# Patient Record
Sex: Female | Born: 1965 | Race: White | Hispanic: No | Smoking: Current every day smoker
Health system: Southern US, Community
[De-identification: ages and names within clinical notes are randomized; demographics above are authoritative.]

## PROBLEM LIST (undated history)

## (undated) DIAGNOSIS — F419 Anxiety disorder, unspecified: Secondary | ICD-10-CM

## (undated) DIAGNOSIS — F32A Depression, unspecified: Secondary | ICD-10-CM

## (undated) DIAGNOSIS — F329 Major depressive disorder, single episode, unspecified: Secondary | ICD-10-CM

## (undated) HISTORY — DX: Anxiety disorder, unspecified: F41.9

## (undated) HISTORY — DX: Depression, unspecified: F32.A

## (undated) HISTORY — DX: Major depressive disorder, single episode, unspecified: F32.9

---

## 2015-08-19 ENCOUNTER — Ambulatory Visit (INDEPENDENT_AMBULATORY_CARE_PROVIDER_SITE_OTHER): Payer: BLUE CROSS/BLUE SHIELD | Admitting: Podiatry

## 2015-08-19 ENCOUNTER — Ambulatory Visit: Payer: Self-pay | Admitting: Podiatry

## 2015-08-19 ENCOUNTER — Encounter: Payer: Self-pay | Admitting: Podiatry

## 2015-08-19 VITALS — BP 104/67 | HR 84 | Resp 16

## 2015-08-19 DIAGNOSIS — L600 Ingrowing nail: Secondary | ICD-10-CM

## 2015-08-19 NOTE — Patient Instructions (Signed)

## 2015-08-19 NOTE — Progress Notes (Signed)
   Subjective:    Patient ID: Jo Moore, female    DOB: 1965/06/01, 50 y.o.   MRN: [id number]  HPI: She presents today first toes bilateral tender for many months gets regular pedicures and then has to clean the corners out. She states that the corners are ingrown and it is a genetic predisposition because her father had it. She states that she have the entire toenail taken off and let sit regrow and it does fine for a while and then it'll start to curve and once again. She does not want to have the toenail removed permanently.    Review of Systems  All other systems reviewed and are negative.      Objective:   Physical Exam: Vital signs are stable she is alert and oriented 3 pulses are strongly palpable. Neurologic sensorium is intact. Deep tendon reflexes are intact bilateral and muscle strength was 5 over 5 dorsiflexors plantar flexors and inverters and everters all intrinsic musculature is intact. Orthopedic evaluation demonstrates all joints distal to the ankle for range of motion without crepitation. Cutaneous evaluation demonstrates severe sharp recovery nail margins along the tibial and fibular border of the hallux bilaterally with erythema edema and pain.        Assessment & Plan:  Ingrown nail paronychia hallux bilateral.  Plan: Total nail avulsion temporary in nature after local anesthesia was administered tolerated the procedure well no intraoperative complications. She was provided with bipolar and ongoing instructions for care and soaking of her toes. I will follow-up with her in 1 week.  Arbutus Ped DPM

## 2015-08-28 ENCOUNTER — Ambulatory Visit: Payer: BLUE CROSS/BLUE SHIELD | Admitting: Podiatry

## 2015-09-02 ENCOUNTER — Encounter: Payer: Self-pay | Admitting: Podiatry

## 2015-09-02 ENCOUNTER — Ambulatory Visit (INDEPENDENT_AMBULATORY_CARE_PROVIDER_SITE_OTHER): Payer: BLUE CROSS/BLUE SHIELD | Admitting: Podiatry

## 2015-09-02 DIAGNOSIS — L600 Ingrowing nail: Secondary | ICD-10-CM

## 2015-09-02 NOTE — Progress Notes (Signed)
She presents today for follow-up of known avulsion hallux bilateral. She states it is doing just great discomfort back in the beach while soaking them on the ocean water and playing with them in the sand. She states they feel much better than he did previously states she stop soaking 5 days ago.  Objective: Her signs are stable she is alert and oriented 3. Pulses are palpable no open lesions or wounds. The skin is growing nicely filling in the nail plate and nailbed. See no signs of infection.  Assessment: Well-healing nail avulsion hallux bilateral.  Plan: Discontinue soaking and I will follow-up with her on an as-needed basis. I expect to see her approximately year once those nails regrow.

## 2015-12-26 ENCOUNTER — Other Ambulatory Visit: Payer: Self-pay | Admitting: Obstetrics and Gynecology

## 2015-12-26 DIAGNOSIS — Z1231 Encounter for screening mammogram for malignant neoplasm of breast: Secondary | ICD-10-CM

## 2016-01-31 ENCOUNTER — Encounter (HOSPITAL_COMMUNITY): Payer: Self-pay

## 2016-01-31 ENCOUNTER — Ambulatory Visit
Admission: RE | Admit: 2016-01-31 | Discharge: 2016-01-31 | Disposition: A | Discharge status: Home / Self Care | Payer: BLUE CROSS/BLUE SHIELD | Source: Ambulatory Visit | Attending: Obstetrics and Gynecology | Admitting: Obstetrics and Gynecology

## 2016-01-31 DIAGNOSIS — Z1231 Encounter for screening mammogram for malignant neoplasm of breast: Secondary | ICD-10-CM | POA: Insufficient documentation

## 2016-07-27 ENCOUNTER — Encounter: Payer: Self-pay | Admitting: Podiatry

## 2016-07-27 ENCOUNTER — Ambulatory Visit (INDEPENDENT_AMBULATORY_CARE_PROVIDER_SITE_OTHER): Payer: BLUE CROSS/BLUE SHIELD | Admitting: Podiatry

## 2016-07-27 DIAGNOSIS — L600 Ingrowing nail: Secondary | ICD-10-CM

## 2016-07-27 MED ORDER — NEOMYCIN-POLYMYXIN-HC 1 % OT SOLN: OTIC | 1 refills | Status: DC

## 2016-07-27 NOTE — Patient Instructions (Signed)

## 2016-07-27 NOTE — Progress Notes (Signed)
She presents today to complaint of sharp radial nail margin along the tibia-fibula border of the hallux right.  Objective: Pulses remain palpable she has severe incurvated nail margins with erythema and edema along the tibiofibular border of the hallux nail plate right. Onychocryptosis is present.  Assessment sharp rated ingrown toenails.  Plan: Chemical matrixectomy was performed to the hallux right today. She tolerated procedure well was provided with a prescription for Cortisporin Otic she was also provided with both oral and written home-going instructions for the care and soaking of her toe. Follow up with her in 1-2 weeks.

## 2016-08-17 ENCOUNTER — Ambulatory Visit (INDEPENDENT_AMBULATORY_CARE_PROVIDER_SITE_OTHER): Payer: BLUE CROSS/BLUE SHIELD | Admitting: Podiatry

## 2016-08-17 ENCOUNTER — Encounter: Payer: Self-pay | Admitting: Podiatry

## 2016-08-17 DIAGNOSIS — L03031 Cellulitis of right toe: Secondary | ICD-10-CM

## 2016-08-17 NOTE — Patient Instructions (Signed)

## 2016-08-17 NOTE — Progress Notes (Signed)
She presents today for follow-up procedure the hallux she states is a little bit red and somewhat tender sure first of the hallux right.  Objective: Vital signs her social alert and oriented 3 she's been soaking in Betadine every other day or so. Mild erythema along the proximal nail fold or tingling the course of the matrixectomy's were performed. The distal nail does appear to be loose and the area does not appear to be infected.  Assessment: Matrixectomy hallux right mild paronychia.  Plan: Epsom salts and warm water once twice a day covered and they leave open and at time follow-up with me as needed.

## 2016-09-01 ENCOUNTER — Other Ambulatory Visit: Payer: Self-pay | Admitting: Otolaryngology

## 2016-09-01 DIAGNOSIS — R221 Localized swelling, mass and lump, neck: Secondary | ICD-10-CM

## 2016-09-04 ENCOUNTER — Ambulatory Visit
Admission: RE | Admit: 2016-09-04 | Discharge: 2016-09-04 | Disposition: A | Discharge status: Home / Self Care | Payer: BLUE CROSS/BLUE SHIELD | Source: Ambulatory Visit | Attending: Otolaryngology | Admitting: Otolaryngology

## 2016-09-04 DIAGNOSIS — R911 Solitary pulmonary nodule: Secondary | ICD-10-CM | POA: Diagnosis not present

## 2016-09-04 DIAGNOSIS — M4302 Spondylolysis, cervical region: Secondary | ICD-10-CM | POA: Diagnosis not present

## 2016-09-04 DIAGNOSIS — R221 Localized swelling, mass and lump, neck: Secondary | ICD-10-CM | POA: Diagnosis present

## 2016-09-04 DIAGNOSIS — J439 Emphysema, unspecified: Secondary | ICD-10-CM | POA: Insufficient documentation

## 2016-09-04 MED ORDER — IOPAMIDOL (ISOVUE-300) INJECTION 61%
75.0000 mL | Freq: Once | INTRAVENOUS | Status: AC | PRN
  Administered 2016-09-04: 75 mL via INTRAVENOUS

## 2016-12-15 ENCOUNTER — Ambulatory Visit (INDEPENDENT_AMBULATORY_CARE_PROVIDER_SITE_OTHER): Payer: BLUE CROSS/BLUE SHIELD | Admitting: Licensed Clinical Social Worker

## 2016-12-15 DIAGNOSIS — F331 Major depressive disorder, recurrent, moderate: Secondary | ICD-10-CM

## 2016-12-15 DIAGNOSIS — F411 Generalized anxiety disorder: Secondary | ICD-10-CM

## 2016-12-25 ENCOUNTER — Encounter: Payer: Self-pay | Admitting: Psychiatry

## 2016-12-25 ENCOUNTER — Ambulatory Visit (INDEPENDENT_AMBULATORY_CARE_PROVIDER_SITE_OTHER): Payer: BLUE CROSS/BLUE SHIELD | Admitting: Psychiatry

## 2016-12-25 ENCOUNTER — Other Ambulatory Visit: Payer: Self-pay

## 2016-12-25 VITALS — BP 111/64 | HR 105 | Temp 98.5°F | Ht 61.02 in | Wt 136.0 lb

## 2016-12-25 DIAGNOSIS — F330 Major depressive disorder, recurrent, mild: Secondary | ICD-10-CM

## 2016-12-25 MED ORDER — TRAZODONE HCL 50 MG PO TABS: 50.0000 mg | ORAL_TABLET | Freq: Every day | ORAL | 1 refills | Status: DC

## 2016-12-25 MED ORDER — BUPROPION HCL 75 MG PO TABS: 75.0000 mg | ORAL_TABLET | Freq: Two times a day (BID) | ORAL | 1 refills | Status: DC
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
## 2016-12-25 NOTE — Patient Instructions (Signed)
Trazodone tablets What is this medicine? TRAZODONE (TRAZ oh done) is used to treat depression. This medicine may be used for other purposes; ask your health care provider or pharmacist if you have questions. COMMON BRAND NAME(S): Desyrel What should I tell my health care provider before I take this medicine? They need to know if you have any of these conditions: -attempted suicide or thinking about it -bipolar disorder -bleeding problems -glaucoma -heart disease, or previous heart attack -irregular heart beat -kidney or liver disease -low levels of sodium in the blood -an unusual or allergic reaction to trazodone, other medicines, foods, dyes or preservatives -pregnant or trying to get pregnant -breast-feeding How should I use this medicine? Take this medicine by mouth with a glass of water. Follow the directions on the prescription label. Take this medicine shortly after a meal or a light snack. Take your medicine at regular intervals. Do not take your medicine more often than directed. Do not stop taking this medicine suddenly except upon the advice of your doctor. Stopping this medicine too quickly may cause serious side effects or your condition may worsen. A special MedGuide will be given to you by the pharmacist with each prescription and refill. Be sure to read this information carefully each time. Talk to your pediatrician regarding the use of this medicine in children. Special care may be needed. Overdosage: If you think you have taken too much of this medicine contact a poison control center or emergency room at once. NOTE: This medicine is only for you. Do not share this medicine with others. What if I miss a dose? If you miss a dose, take it as soon as you can. If it is almost time for your next dose, take only that dose. Do not take double or extra doses. What may interact with this medicine? Do not take this medicine with any of the following medications: -certain medicines  for fungal infections like fluconazole, itraconazole, ketoconazole, posaconazole, voriconazole -cisapride -dofetilide -dronedarone -linezolid -MAOIs like Carbex, Eldepryl, Marplan, Nardil, and Parnate -mesoridazine -methylene blue (injected into a vein) -pimozide -saquinavir -thioridazine -ziprasidone This medicine may also interact with the following medications: -alcohol -antiviral medicines for HIV or AIDS -aspirin and aspirin-like medicines -barbiturates like phenobarbital -certain medicines for blood pressure, heart disease, irregular heart beat -certain medicines for depression, anxiety, or psychotic disturbances -certain medicines for migraine headache like almotriptan, eletriptan, frovatriptan, naratriptan, rizatriptan, sumatriptan, zolmitriptan -certain medicines for seizures like carbamazepine and phenytoin -certain medicines for sleep -certain medicines that treat or prevent blood clots like dalteparin, enoxaparin, warfarin -digoxin -fentanyl -lithium -NSAIDS, medicines for pain and inflammation, like ibuprofen or naproxen -other medicines that prolong the QT interval (cause an abnormal heart rhythm) -rasagiline -supplements like St. John's wort, kava kava, valerian -tramadol -tryptophan This list may not describe all possible interactions. Give your health care provider a list of all the medicines, herbs, non-prescription drugs, or dietary supplements you use. Also tell them if you smoke, drink alcohol, or use illegal drugs. Some items may interact with your medicine. What should I watch for while using this medicine? Tell your doctor if your symptoms do not get better or if they get worse. Visit your doctor or health care professional for regular checks on your progress. Because it may take several weeks to see the full effects of this medicine, it is important to continue your treatment as prescribed by your doctor. Patients and their families should watch out for new  or worsening thoughts of suicide or depression. Also   watch out for sudden changes in feelings such as feeling anxious, agitated, panicky, irritable, hostile, aggressive, impulsive, severely restless, overly excited and hyperactive, or not being able to sleep. If this happens, especially at the beginning of treatment or after a change in dose, call your health care professional. You may get drowsy or dizzy. Do not drive, use machinery, or do anything that needs mental alertness until you know how this medicine affects you. Do not stand or sit up quickly, especially if you are an older patient. This reduces the risk of dizzy or fainting spells. Alcohol may interfere with the effect of this medicine. Avoid alcoholic drinks. This medicine may cause dry eyes and blurred vision. If you wear contact lenses you may feel some discomfort. Lubricating drops may help. See your eye doctor if the problem does not go away or is severe. Your mouth may get dry. Chewing sugarless gum, sucking hard candy and drinking plenty of water may help. Contact your doctor if the problem does not go away or is severe. What side effects may I notice from receiving this medicine? Side effects that you should report to your doctor or health care professional as soon as possible: -allergic reactions like skin rash, itching or hives, swelling of the face, lips, or tongue -elevated mood, decreased need for sleep, racing thoughts, impulsive behavior -confusion -fast, irregular heartbeat -feeling faint or lightheaded, falls -feeling agitated, angry, or irritable -loss of balance or coordination -painful or prolonged erections -restlessness, pacing, inability to keep still -suicidal thoughts or other mood changes -tremors -trouble sleeping -seizures -unusual bleeding or bruising Side effects that usually do not require medical attention (report to your doctor or health care professional if they continue or are bothersome): -change in  sex drive or performance -change in appetite or weight -constipation -headache -muscle aches or pains -nausea This list may not describe all possible side effects. Call your doctor for medical advice about side effects. You may report side effects to FDA at 1-800-FDA-1088. Where should I keep my medicine? Keep out of the reach of children. Store at room temperature between 15 and 30 degrees C (59 to 86 degrees F). Protect from light. Keep container tightly closed. Throw away any unused medicine after the expiration date. NOTE: This sheet is a summary. It may not cover all possible information. If you have questions about this medicine, talk to your doctor, pharmacist, or health care provider.  2018 Elsevier/Gold Standard (2015-06-13 16:57:05) Bupropion tablets (Depression/Mood Disorders) What is this medicine? BUPROPION (byoo PROE pee on) is used to treat depression. This medicine may be used for other purposes; ask your health care provider or pharmacist if you have questions. COMMON BRAND NAME(S): Wellbutrin What should I tell my health care provider before I take this medicine? They need to know if you have any of these conditions: -an eating disorder, such as anorexia or bulimia -bipolar disorder or psychosis -diabetes or high blood sugar, treated with medication -glaucoma -heart disease, previous heart attack, or irregular heart beat -head injury or brain tumor -high blood pressure -kidney or liver disease -seizures -suicidal thoughts or a previous suicide attempt -Tourette's syndrome -weight loss -an unusual or allergic reaction to bupropion, other medicines, foods, dyes, or preservatives -breast-feeding -pregnant or trying to become pregnant How should I use this medicine? Take this medicine by mouth with a glass of water. Follow the directions on the prescription label. You can take it with or without food. If it upsets your stomach, take it with food.   Take your medicine  at regular intervals. Do not take your medicine more often than directed. Do not stop taking this medicine suddenly except upon the advice of your doctor. Stopping this medicine too quickly may cause serious side effects or your condition may worsen. A special MedGuide will be given to you by the pharmacist with each prescription and refill. Be sure to read this information carefully each time. Talk to your pediatrician regarding the use of this medicine in children. Special care may be needed. Overdosage: If you think you have taken too much of this medicine contact a poison control center or emergency room at once. NOTE: This medicine is only for you. Do not share this medicine with others. What if I miss a dose? If you miss a dose, take it as soon as you can. If it is less than four hours to your next dose, take only that dose and skip the missed dose. Do not take double or extra doses. What may interact with this medicine? Do not take this medicine with any of the following medications: -linezolid -MAOIs like Azilect, Carbex, Eldepryl, Marplan, Nardil, and Parnate -methylene blue (injected into a vein) -other medicines that contain bupropion like Zyban This medicine may also interact with the following medications: -alcohol -certain medicines for anxiety or sleep -certain medicines for blood pressure like metoprolol, propranolol -certain medicines for depression or psychotic disturbances -certain medicines for HIV or AIDS like efavirenz, lopinavir, nelfinavir, ritonavir -certain medicines for irregular heart beat like propafenone, flecainide -certain medicines for Parkinson's disease like amantadine, levodopa -certain medicines for seizures like carbamazepine, phenytoin, phenobarbital -cimetidine -clopidogrel -cyclophosphamide -digoxin -furazolidone -isoniazid -nicotine -orphenadrine -procarbazine -steroid medicines like prednisone or cortisone -stimulant medicines for attention  disorders, weight loss, or to stay awake -tamoxifen -theophylline -thiotepa -ticlopidine -tramadol -warfarin This list may not describe all possible interactions. Give your health care provider a list of all the medicines, herbs, non-prescription drugs, or dietary supplements you use. Also tell them if you smoke, drink alcohol, or use illegal drugs. Some items may interact with your medicine. What should I watch for while using this medicine? Tell your doctor if your symptoms do not get better or if they get worse. Visit your doctor or health care professional for regular checks on your progress. Because it may take several weeks to see the full effects of this medicine, it is important to continue your treatment as prescribed by your doctor. Patients and their families should watch out for new or worsening thoughts of suicide or depression. Also watch out for sudden changes in feelings such as feeling anxious, agitated, panicky, irritable, hostile, aggressive, impulsive, severely restless, overly excited and hyperactive, or not being able to sleep. If this happens, especially at the beginning of treatment or after a change in dose, call your health care professional. Avoid alcoholic drinks while taking this medicine. Drinking excessive alcoholic beverages, using sleeping or anxiety medicines, or quickly stopping the use of these agents while taking this medicine may increase your risk for a seizure. Do not drive or use heavy machinery until you know how this medicine affects you. This medicine can impair your ability to perform these tasks. Do not take this medicine close to bedtime. It may prevent you from sleeping. Your mouth may get dry. Chewing sugarless gum or sucking hard candy, and drinking plenty of water may help. Contact your doctor if the problem does not go away or is severe. What side effects may I notice from receiving this medicine? Side   effects that you should report to your doctor  or health care professional as soon as possible: -allergic reactions like skin rash, itching or hives, swelling of the face, lips, or tongue -breathing problems -changes in vision -confusion -elevated mood, decreased need for sleep, racing thoughts, impulsive behavior -fast or irregular heartbeat -hallucinations, loss of contact with reality -increased blood pressure -redness, blistering, peeling or loosening of the skin, including inside the mouth -seizures -suicidal thoughts or other mood changes -unusually weak or tired -vomiting Side effects that usually do not require medical attention (report to your doctor or health care professional if they continue or are bothersome): -constipation -headache -loss of appetite -nausea -tremors -weight loss This list may not describe all possible side effects. Call your doctor for medical advice about side effects. You may report side effects to FDA at 1-800-FDA-1088. Where should I keep my medicine? Keep out of the reach of children. Store at room temperature between 20 and 25 degrees C (68 and 77 degrees F), away from direct sunlight and moisture. Keep tightly closed. Throw away any unused medicine after the expiration date. NOTE: This sheet is a summary. It may not cover all possible information. If you have questions about this medicine, talk to your doctor, pharmacist, or health care provider.  2018 Elsevier/Gold Standard (2015-07-05 13:44:21)  

## 2017-01-12 ENCOUNTER — Ambulatory Visit (INDEPENDENT_AMBULATORY_CARE_PROVIDER_SITE_OTHER): Payer: BLUE CROSS/BLUE SHIELD | Admitting: Licensed Clinical Social Worker

## 2017-01-12 DIAGNOSIS — F411 Generalized anxiety disorder: Secondary | ICD-10-CM

## 2017-01-12 DIAGNOSIS — F331 Major depressive disorder, recurrent, moderate: Secondary | ICD-10-CM | POA: Diagnosis not present

## 2017-01-15 ENCOUNTER — Ambulatory Visit (INDEPENDENT_AMBULATORY_CARE_PROVIDER_SITE_OTHER): Payer: BLUE CROSS/BLUE SHIELD | Admitting: Psychiatry

## 2017-01-15 ENCOUNTER — Other Ambulatory Visit: Payer: Self-pay

## 2017-01-15 ENCOUNTER — Encounter: Payer: Self-pay | Admitting: Psychiatry

## 2017-01-15 VITALS — BP 117/78 | HR 77 | Temp 98.5°F | Wt 133.6 lb

## 2017-01-15 DIAGNOSIS — F331 Major depressive disorder, recurrent, moderate: Secondary | ICD-10-CM | POA: Diagnosis not present

## 2017-01-15 MED ORDER — BUPROPION HCL ER (XL) 150 MG PO TB24: 150.0000 mg | ORAL_TABLET | Freq: Every day | ORAL | 2 refills | Status: DC

## 2017-01-15 MED ORDER — MELATONIN 3 MG PO TABS: 3.0000 mg | ORAL_TABLET | Freq: Every evening | ORAL | 2 refills | Status: AC | PRN

## 2017-01-15 MED ORDER — ESCITALOPRAM OXALATE 5 MG PO TABS: 5.0000 mg | ORAL_TABLET | Freq: Every day | ORAL | 1 refills | Status: DC

## 2017-01-15 NOTE — Progress Notes (Signed)
[facility] MD OP Progress Note  01/15/2017 11:46 AM Jo Moore  MRN:  [id number]  Chief Complaint: ' I am still depressed.'  Chief Complaint    Follow-up; Medication Refill     HPI: Jo Moore  is a 51 year old Caucasian female, divorced, self-employed, who lives in [place] has a history of depression, presented to the clinic for a follow-up visit.  Jo Moore reports that she continues to feel depressed.  She does notice some changes like reduce crying spells, and feeling more focused.  She however continues to struggle with the lack of motivation low energy and so on.  She reports she knows it is too soon to say however she feels like her sister is taking combination of Wellbutrin and Lexapro and she is doing well.  She reports that she would like to try that combination and see if that will work for her.  Discussed with her that she was just recently started on the Wellbutrin but if it is her preference to be started on Lexapro side-by-side to augment the Wellbutrin that would be fine.  She will monitor her symptoms closely and if she has any side effects she will discontinue the medication.  Reports that she tried the trazodone for sleep but she did not feel good on it.  She reports she usually gets a good night sleep but recently she fell while she was taking her dog out and she is bruised all over.  She reports that because of the pain she has not been sleeping the past few days.  Discussed melatonin the patient, she agrees with that.  She reports she continues to have stressors like having a mother with dementia.  And also her daughter who is not doing well academically.  She continues to drink 1-2 drinks of beer regularly.  She denies any withdrawal symptoms.    Visit Diagnosis:    ICD-10-CM   1. MDD (major depressive disorder), recurrent episode, moderate (HCC) F33.1 escitalopram (LEXAPRO) 5 MG tablet    buPROPion (WELLBUTRIN XL) 150 MG 24 hr tablet    Melatonin 3 MG TABS    Past  Psychiatric History: Depression diagnosed in the [id number].  She reports she was started on Effexor/Wellbutrin and took it until the 1990s or so.  She reports the reason she discontinued the medication was because she improved.  She reports she was [place] by psychiatrist at that time  in [place].  She denies any IP admission for mental health reasons.  She denies suicide attempts.  Past Medical History:  Past Medical History:  Diagnosis Date  . Anxiety   . Depression    History reviewed. No pertinent surgical history.  Family Psychiatric History: Depression runs in her family.  She reports her mom has depression and dementia.  Her sister has depression.  Her 319 year old daughter has depression/anxiety.  She denies substance abuse problems in her family.  She denies suicide in her family  Family History:  Family History  Problem Relation Age of Onset  . Depression Mother   . Depression Sister   . Breast cancer Neg Hx    Substance abuse history Denies  Social History: Divorced.  She has 2 daughters, 917 and 51 years old.  She is self-employed-estate sales.  She has an associate degree. Social History   Socioeconomic History  . Marital status: Divorced    Spouse name: None  . Number of children: 2  . Years of education: None  . Highest education level: Associate degree: academic program  Social Needs  .  Financial resource strain: Not hard at all  . Food insecurity - worry: Never true  . Food insecurity - inability: Never true  . Transportation needs - medical: No  . Transportation needs - non-medical: No  Occupational History    Comment: self employed  Tobacco Use  . Smoking status: Current Every Day Smoker    Packs/day: 2.00    Types: Cigarettes  . Smokeless tobacco: Never Used  Substance and Sexual Activity  . Alcohol use: Yes    Alcohol/week: 4.2 oz    Types: 1 Glasses of wine, 6 Cans of beer per week  . Drug use: No  . Sexual activity: No  Other Topics Concern  . None   Social History Narrative  . None    Allergies: No Known Allergies  Metabolic Disorder Labs: No results found for: HGBA1C, MPG No results found for: PROLACTIN No results found for: CHOL, TRIG, HDL, CHOLHDL, VLDL, LDLCALC No results found for: TSH  Therapeutic Level Labs: No results found for: LITHIUM No results found for: VALPROATE No components found for:  CBMZ  Current Medications: Current Outpatient Medications  Medication Sig Dispense Refill  . levonorgestrel (MIRENA) 20 MCG/24HR IUD by Intrauterine route.    Marland Kitchen buPROPion (WELLBUTRIN XL) 150 MG 24 hr tablet Take 1 tablet (150 mg total) by mouth daily. 30 tablet 2  . escitalopram (LEXAPRO) 5 MG tablet Take 1 tablet (5 mg total) by mouth daily. 30 tablet 1  . Melatonin 3 MG TABS Take 1 tablet (3 mg total) by mouth at bedtime as needed. 30 tablet 2   No current facility-administered medications for this visit.      Musculoskeletal: Strength & Muscle Tone: within normal limits Gait & Station: normal Patient leans: N/A  Psychiatric Specialty Exam: Review of Systems  Musculoskeletal: Positive for myalgias.  Psychiatric/Behavioral: Positive for depression. The patient is nervous/anxious and has insomnia.   All other systems reviewed and are negative.   Blood pressure 117/78, pulse 77, temperature 98.5 F (36.9 C), temperature source Oral, weight 133 lb 9.6 oz (60.6 kg).Body mass index is 25.22 kg/m.  General Appearance: Casual  Eye Contact:  Fair  Speech:  Clear and Coherent  Volume:  Normal  Mood:  Anxious and Dysphoric  Affect:  Congruent  Thought Process:  Goal Directed and Descriptions of Associations: Circumstantial  Orientation:  Full (Time, Place, and Person)  Thought Content: Logical   Suicidal Thoughts:  No  Homicidal Thoughts:  No  Memory:  Immediate;   Fair Recent;   Fair Remote;   Fair  Judgement:  Fair  Insight:  Good  Psychomotor Activity:  Normal  Concentration:  Concentration: Fair and  Attention Span: Fair  Recall:  [employer] of Knowledge: Fair  Language: Fair  Akathisia:  No  Handed:  Right  AIMS (if indicated): NA  Assets:  Communication Skills Desire for Improvement Financial Resources/Insurance Housing Physical Health Social Support Talents/Skills  ADL's:  Intact  Cognition: WNL  Sleep:  Poor   Screenings:   Assessment and Plan: Vollie is a 51 year old Caucasian female who has a history of depression who presented to the clinic for a follow-up visit.  Reginae continues to report some depressive symptoms even though she has seen some improvement.  Dameisha reports she would like to try combining Wellbutrin with Lexapro since she feels some of her family members including her sister is on that combination and is actually doing well.  Lenetta will continue to follow-up with Ms. Nolon Rod for therapy  here in clinic.  Plan as noted below  Plan For depression Change Wellbutrin XL to 150 mg p.o. Daily Add Lexapro 5 mg p.o. daily Continue CBT with Ms. Nolon RodNicole Peacock  Insomnia Discontinue trazodone for intolerance Start melatonin 3 mg p.o. nightly.  Provided medication education, provided handouts.  Follow-up in 6 weeks or sooner if needed.  More than 50 % of the time was spent for psychoeducation and supportive psychotherapy and care coordination.  This note was generated in part or whole with voice recognition software. Voice recognition is usually quite accurate but there are transcription errors that can and very often do occur. I apologize for any typographical errors that were not detected and corrected.        Jomarie LongsSaramma Halim Surrette, MD 01/15/2017, 11:46 AM

## 2017-01-15 NOTE — Patient Instructions (Signed)
Melatonin oral solution What is this medicine? MELATONIN (mel uh TOH nin) is a dietary supplement. It is promoted to help maintain normal sleep patterns. The FDA has not approved this supplement for any medical use. This supplement may be used for other purposes; ask your health care provider or pharmacist if you have questions. This medicine may be used for other purposes; ask your health care provider or pharmacist if you have questions. What should I tell my health care provider before I take this medicine? They need to know if you have any of these conditions: -cancer -depression or mental illness -diabetes -hormone problems -if you often drink alcohol -immune system problems -liver disease -lung or breathing disease, like asthma -organ transplant -seizure disorder -an unusual or allergic reaction to melatonin, other medicines, foods, dyes, or preservatives -pregnant or trying to get pregnant -breast-feeding How should I use this medicine? Take this supplement by mouth with a glass of water. Do not take with food. Use a specially marked spoon or container to measure each dose. Ask your pharmacist if you do not have one. Household spoons are not accurate. This supplement is usually taken 1 or 2 hours before bedtime. After taking this supplement, limit your activities to those needed to prepare for bed. Follow the directions on the package labeling, or take as directed by your health care professional. Do not take this supplement more often than directed. Talk to your pediatrician regarding the use of this supplement in children. Special care may be needed. This supplement is not recommended for use in children without a prescription. Overdosage: If you think you have taken too much of this medicine contact a poison control center or emergency room at once. NOTE: This medicine is only for you. Do not share this medicine with others. What if I miss a dose? If you miss a dose, take it as  soon as you can. If it is almost time for your next dose, take only that dose. Do not take double or extra doses. What may interact with this medicine? Do not take this medicine with any of the following medications: -fluvoxamine -ramelteon -tasimelteon This medicine may also interact with the following medications: -alcohol -caffeine -carbamazepine -certain antibiotics like ciprofloxacin -certain medicines for depression, anxiety, or psychotic disturbances -cimetidine -female hormones, like estrogens and birth control pills, patches, rings, or injections -methoxsalen -nifedipine -other medications for sleep -other herbal or dietary supplements -phenobarbital -rifampin -smoking tobacco -tamoxifen -warfarin This list may not describe all possible interactions. Give your health care provider a list of all the medicines, herbs, non-prescription drugs, or dietary supplements you use. Also tell them if you smoke, drink alcohol, or use illegal drugs. Some items may interact with your medicine. What should I watch for while using this medicine? If you are already being treated for insomnia use this supplement only by your doctor's direction. This supplement may interfere with other treatments. See your doctor if your symptoms do not get better or if they get worse. You may get drowsy or dizzy. Do not drive, use machinery, or do anything that needs mental alertness until you know how this medicine affects you. Do not stand or sit up quickly, especially if you are an older patient. This reduces the risk of dizzy or fainting spells. Alcohol may interfere with the effect of this medicine. Avoid alcoholic drinks. Herbal or dietary supplements are not regulated like medicines. Rigid quality control standards are not required for dietary supplements. The purity and strength of these products  can vary. The safety and effect of this dietary supplement for a certain disease or illness is not well known.  This product is not intended to diagnose, treat, cure or prevent any disease. The Food and Drug Administration suggests the following to help consumers protect themselves: -Always read product labels and follow directions. -Natural does not mean a product is safe for humans to take. -Look for products that include USP after the ingredient name. This means that the manufacturer followed the standards of the [place] Pharmacopoeia. -Supplements made or sold by a nationally known food or drug company are more likely to be made under tight controls. You can write to the company for more information about how the product was made. What side effects may I notice from receiving this medicine? Side effects that you should report to your doctor or health care professional as soon as possible: -allergic reactions like skin rash, itching or hives, swelling of the face, lips, or tongue -breathing problems -confusion -depressed mood, irritable, or other changes in moods or behaviors -feeling faint or lightheaded, falls -increased blood pressure -irregular or missed menstrual periods -signs and symptoms of liver injury like dark yellow or brown urine; general ill feeling or flu-like symptoms; light-colored stools; loss of appetite; nausea; right upper belly pain; unusually weak or tired; yellowing of the eyes or skin -trouble staying awake or alert during the day -unusual activities while you are still asleep like driving, eating, making phone calls -unusual bleeding or bruising Side effects that usually do not require medical attention (report to your doctor or health care professional if they continue or are bothersome): -dizziness -drowsiness -headache -hot flashes -nausea -tiredness -unusual dreams or nightmares -upset stomach This list may not describe all possible side effects. Call your doctor for medical advice about side effects. You may report side effects to FDA at 1-800-FDA-1088. Where should I  keep my medicine? Keep out of the reach of children. Store as directed on the package label. Throw away any unused supplement after the expiration date. NOTE: This sheet is a summary. It may not cover all possible information. If you have questions about this medicine, talk to your doctor, pharmacist, or health care provider.  2018 Elsevier/Gold Standard (2015-09-19 11:46:30) Escitalopram tablets What is this medicine? ESCITALOPRAM (es sye TAL oh pram) is used to treat depression and certain types of anxiety. This medicine may be used for other purposes; ask your health care provider or pharmacist if you have questions. COMMON BRAND NAME(S): Lexapro What should I tell my health care provider before I take this medicine? They need to know if you have any of these conditions: -bipolar disorder or a family history of bipolar disorder -diabetes -glaucoma -heart disease -kidney or liver disease -receiving electroconvulsive therapy -seizures (convulsions) -suicidal thoughts, plans, or attempt by you or a family member -an unusual or allergic reaction to escitalopram, the related drug citalopram, other medicines, foods, dyes, or preservatives -pregnant or trying to become pregnant -breast-feeding How should I use this medicine? Take this medicine by mouth with a glass of water. Follow the directions on the prescription label. You can take it with or without food. If it upsets your stomach, take it with food. Take your medicine at regular intervals. Do not take it more often than directed. Do not stop taking this medicine suddenly except upon the advice of your doctor. Stopping this medicine too quickly may cause serious side effects or your condition may worsen. A special MedGuide will be given to you  by the pharmacist with each prescription and refill. Be sure to read this information carefully each time. Talk to your pediatrician regarding the use of this medicine in children. Special care may  be needed. Overdosage: If you think you have taken too much of this medicine contact a poison control center or emergency room at once. NOTE: This medicine is only for you. Do not share this medicine with others. What if I miss a dose? If you miss a dose, take it as soon as you can. If it is almost time for your next dose, take only that dose. Do not take double or extra doses. What may interact with this medicine? Do not take this medicine with any of the following medications: -certain medicines for fungal infections like fluconazole, itraconazole, ketoconazole, posaconazole, voriconazole -cisapride -citalopram -dofetilide -dronedarone -linezolid -MAOIs like Carbex, Eldepryl, Marplan, Nardil, and Parnate -methylene blue (injected into a vein) -pimozide -thioridazine -ziprasidone This medicine may also interact with the following medications: -alcohol -amphetamines -aspirin and aspirin-like medicines -carbamazepine -certain medicines for depression, anxiety, or psychotic disturbances -certain medicines for migraine headache like almotriptan, eletriptan, frovatriptan, naratriptan, rizatriptan, sumatriptan, zolmitriptan -certain medicines for sleep -certain medicines that treat or prevent blood clots like warfarin, enoxaparin, dalteparin -cimetidine -diuretics -fentanyl -furazolidone -isoniazid -lithium -metoprolol -NSAIDs, medicines for pain and inflammation, like ibuprofen or naproxen -other medicines that prolong the QT interval (cause an abnormal heart rhythm) -procarbazine -rasagiline -supplements like St. John's wort, kava kava, valerian -tramadol -tryptophan This list may not describe all possible interactions. Give your health care provider a list of all the medicines, herbs, non-prescription drugs, or dietary supplements you use. Also tell them if you smoke, drink alcohol, or use illegal drugs. Some items may interact with your medicine. What should I watch for while  using this medicine? Tell your doctor if your symptoms do not get better or if they get worse. Visit your doctor or health care professional for regular checks on your progress. Because it may take several weeks to see the full effects of this medicine, it is important to continue your treatment as prescribed by your doctor. Patients and their families should watch out for new or worsening thoughts of suicide or depression. Also watch out for sudden changes in feelings such as feeling anxious, agitated, panicky, irritable, hostile, aggressive, impulsive, severely restless, overly excited and hyperactive, or not being able to sleep. If this happens, especially at the beginning of treatment or after a change in dose, call your health care professional. Bonita QuinYou may get drowsy or dizzy. Do not drive, use machinery, or do anything that needs mental alertness until you know how this medicine affects you. Do not stand or sit up quickly, especially if you are an older patient. This reduces the risk of dizzy or fainting spells. Alcohol may interfere with the effect of this medicine. Avoid alcoholic drinks. Your mouth may get dry. Chewing sugarless gum or sucking hard candy, and drinking plenty of water may help. Contact your doctor if the problem does not go away or is severe. What side effects may I notice from receiving this medicine? Side effects that you should report to your doctor or health care professional as soon as possible: -allergic reactions like skin rash, itching or hives, swelling of the face, lips, or tongue -anxious -black, tarry stools -changes in vision -confusion -elevated mood, decreased need for sleep, racing thoughts, impulsive behavior -eye pain -fast, irregular heartbeat -feeling faint or lightheaded, falls -feeling agitated, angry, or irritable -hallucination, loss of  contact with reality -loss of balance or coordination -loss of memory -painful or prolonged  erections -restlessness, pacing, inability to keep still -seizures -stiff muscles -suicidal thoughts or other mood changes -trouble sleeping -unusual bleeding or bruising -unusually weak or tired -vomiting Side effects that usually do not require medical attention (report to your doctor or health care professional if they continue or are bothersome): -changes in appetite -change in sex drive or performance -headache -increased sweating -indigestion, nausea -tremors This list may not describe all possible side effects. Call your doctor for medical advice about side effects. You may report side effects to FDA at 1-800-FDA-1088. Where should I keep my medicine? Keep out of reach of children. Store at room temperature between 15 and 30 degrees C (59 and 86 degrees F). Throw away any unused medicine after the expiration date. NOTE: This sheet is a summary. It may not cover all possible information. If you have questions about this medicine, talk to your doctor, pharmacist, or health care provider.  2018 Elsevier/Gold Standard (2015-06-17 13:20:23)
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
## 2017-02-02 ENCOUNTER — Ambulatory Visit: Payer: Medicaid Other | Admitting: Licensed Clinical Social Worker

## 2017-02-03 NOTE — Progress Notes (Signed)
   THERAPIST PROGRESS NOTE  Session Time: 60min  Participation Level: Active  Behavioral Response: CasualAlertDepressed  Type of Therapy: Individual Therapy  Treatment Goals addressed: Coping  Interventions: CBT, Motivational Interviewing and Supportive  Summary: Jo Moore is a 52 y.o. female who presents with continued symptoms of her diagnosis.  Patient reports an unfavorable mood.  Patient reports that she was able to visit with her sister who lives out of town.  Patient reports that she is upset with her sister due to her lack of care about their mother and her failing health.  Patient continues to stress out about financials and her [place]'s [place].  Patient reports that she has not worked in several months and is fearful that she will not be able to sustain the needed income.  Patient reports that she wants to succeed but is afraid.  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  WIth several prompts Patient was able to list what exactly that means to her and how to start the process.  Patient was able to list the first 2 things in a priority order.  Patient reports that she will have the first 2 things completed by Jan 25 2017.   Reminded Patient to ensure that she is able to complete self care tasks at least twice month  Suicidal/Homicidal: No  Therapist Response: Therapist actively listened as Patient reported current mood and discussed stressors. Processed with patient about her symptoms and behaviors.    Discussed progress and regression.  Assisted Patient with making goals for work and home.  Discussed self awareness and the importance of understanding body cues. Explored the need of self care and being able to look forward to fun activities.   Plan: Return again in2 weeks.  Diagnosis: Axis I: Generalized Anxiety Disorder and Depression    Axis II: No  diagnosis    Marinda Elkicole M Peacock, LCSW 01/12/2017

## 2017-02-19 ENCOUNTER — Encounter: Payer: Self-pay | Admitting: Psychiatry

## 2017-02-19 ENCOUNTER — Ambulatory Visit (INDEPENDENT_AMBULATORY_CARE_PROVIDER_SITE_OTHER): Payer: BLUE CROSS/BLUE SHIELD | Admitting: Psychiatry

## 2017-02-19 ENCOUNTER — Other Ambulatory Visit: Payer: Self-pay

## 2017-02-19 VITALS — BP 119/65 | HR 73 | Temp 97.9°F | Wt 129.6 lb

## 2017-02-19 DIAGNOSIS — F17200 Nicotine dependence, unspecified, uncomplicated: Secondary | ICD-10-CM

## 2017-02-19 DIAGNOSIS — F331 Major depressive disorder, recurrent, moderate: Secondary | ICD-10-CM

## 2017-02-19 MED ORDER — BUPROPION HCL ER (XL) 150 MG PO TB24: 150.0000 mg | ORAL_TABLET | Freq: Every day | ORAL | 0 refills | Status: DC

## 2017-02-19 MED ORDER — ESCITALOPRAM OXALATE 5 MG PO TABS: 5.0000 mg | ORAL_TABLET | Freq: Every day | ORAL | 0 refills | Status: DC

## 2017-02-19 NOTE — Progress Notes (Signed)
BH MD OP Progress Note  02/19/2017 11:23 AM Jo Moore  MRN:  [id number]  Chief Complaint: ' I am doing well."  Chief Complaint    Follow-up; Medication Refill     HPI: Jo Moore is a 52 year old Caucasian female, divorced, self-employed, lives in [place], has a history of depression, presented to the clinic today for a follow-up visit.  Claudell today reports that she is currently doing well.  She reports the Lexapro took some time to kick in but once it started working she feels much better.  She reports she wants to stay on the current dose of Lexapro since that has been helping her with her mood symptoms along with the Wellbutrin XL which she is on.  She reports her mood as stable, appetite is fair, denies any anhedonia, lack of motivation, low energy and so on.  She reports she also started cutting down on the melatonin since she does not need it every night.  She reports she goes to bed early around 9 - 10  PM instead of going late like she used to before.  She wakes up in the middle of the night to use the bathroom but she is able to fall back asleep soon.  She denies any suicidality or perceptual disturbances.  She reports some of the psychosocial stressors are getting better.  She reports she was able to find the right care for her mother and does not have to pay $2000 per month anymore.  She is currently anxious about her daughter who is having some trouble and would like to see a psychiatrist who may be able to help her.  She continues to smoke cigarettes.  Provided smoking cessation counseling.  She reports she will try quitting may be after her birthday which is coming up.   Visit Diagnosis:    ICD-10-CM   1. MDD (major depressive disorder), recurrent episode, moderate (HCC) F33.1 buPROPion (WELLBUTRIN XL) 150 MG 24 hr tablet    escitalopram (LEXAPRO) 5 MG tablet  2. Tobacco use disorder F17.200     Past Psychiatric History: Depression diagnosed in the 74s.  She reports she  was started on Effexor/Wellbutrin and took it until the 1990s or so.  She reports the reason she discontinued the medication was because she improved.  She reports she was seen by psychiatrist at that time in [place].  She denies any IP admission for mental health reasons.  She denies suicide attempts.  Past Medical History:  Past Medical History:  Diagnosis Date  . Anxiety   . Depression    History reviewed. No pertinent surgical history.  Family Psychiatric History: Depression runs in her family.  She reports her mom has depression and dementia.  Her sister has depression.  Her 76 year old daughter has depression and anxiety.  She denies substance abuse problems in her family.  She denies suicide in her family  Family History:  Family History  Problem Relation Age of Onset  . Depression Mother   . Depression Sister   . Breast cancer Neg Hx    Substance abuse history: Denies  Social History: She lives in [place].  She is divorced.  She has 2 daughters ages 62 and 61 years old.  She is self-employed, Art gallery manager.  She has an associate degree. Social History   Socioeconomic History  . Marital status: Divorced    Spouse name: None  . Number of children: 2  . Years of education: None  . Highest education level: Associate degree: academic program  Social Needs  . Financial resource strain: Not hard at all  . Food insecurity - worry: Never true  . Food insecurity - inability: Never true  . Transportation needs - medical: No  . Transportation needs - non-medical: No  Occupational History    Comment: self employed  Tobacco Use  . Smoking status: Current Every Day Smoker    Packs/day: 2.00    Types: Cigarettes  . Smokeless tobacco: Never Used  Substance and Sexual Activity  . Alcohol use: Yes    Alcohol/week: 4.2 oz    Types: 1 Glasses of wine, 6 Cans of beer per week  . Drug use: No  . Sexual activity: No  Other Topics Concern  . None  Social History Narrative  .  None    Allergies: No Known Allergies  Metabolic Disorder Labs: No results found for: HGBA1C, MPG No results found for: PROLACTIN No results found for: CHOL, TRIG, HDL, CHOLHDL, VLDL, LDLCALC No results found for: TSH  Therapeutic Level Labs: No results found for: LITHIUM No results found for: VALPROATE No components found for:  CBMZ  Current Medications: Current Outpatient Medications  Medication Sig Dispense Refill  . buPROPion (WELLBUTRIN XL) 150 MG 24 hr tablet Take 1 tablet (150 mg total) by mouth daily. 90 tablet 0  . escitalopram (LEXAPRO) 5 MG tablet Take 1 tablet (5 mg total) by mouth daily. 90 tablet 0  . levonorgestrel (MIRENA) 20 MCG/24HR IUD by Intrauterine route.    . Melatonin 3 MG TABS Take 1 tablet (3 mg total) by mouth at bedtime as needed. 30 tablet 2   No current facility-administered medications for this visit.      Musculoskeletal: Strength & Muscle Tone: within normal limits Gait & Station: normal Patient leans: N/A  Psychiatric Specialty Exam: Review of Systems  Psychiatric/Behavioral: Positive for depression (IMPROVED).  All other systems reviewed and are negative.   Blood pressure 119/65, pulse 73, temperature 97.9 F (36.6 C), temperature source Oral, weight 129 lb 9.6 oz (58.8 kg).Body mass index is 24.47 kg/m.  General Appearance: Casual  Eye Contact:  Fair  Speech:  Normal Rate  Volume:  Normal  Mood:  Euthymic  Affect:  Congruent  Thought Process:  Goal Directed and Descriptions of Associations: Intact  Orientation:  Full (Time, Place, and Person)  Thought Content: Logical   Suicidal Thoughts:  No  Homicidal Thoughts:  No  Memory:  Immediate;   Fair Recent;   Fair Remote;   Fair  Judgement:  Fair  Insight:  Fair  Psychomotor Activity:  Normal  Concentration:  Concentration: Fair and Attention Span: Fair  Recall:  FiservFair  Fund of Knowledge: Fair  Language: Fair  Akathisia:  No  Handed:  Right  AIMS (if indicated): NA   Assets:  Communication Skills Desire for Improvement Financial Resources/Insurance Housing Leisure Time Physical Health Resilience Social Support Talents/Skills Transportation Vocational/Educational  ADL's:  Intact  Cognition: WNL  Sleep:  Fair   Screenings:   Assessment and Plan: Marchelle Folksmanda is a 52 year old Caucasian female who has a history of depression, presented to the clinic today for a follow-up visit.  Marchelle Folksmanda is currently doing well on the combination of Lexapro and Wellbutrin.  She continues to have some psychosocial stressors of her daughter going through some mental health problems.  She otherwise denies any new concerns.  Plan as noted below.  Plan   For Depression Continue Wellbutrin XL 150 mg p.o. daily Lexapro 5 mg p.o. Daily.  Insomnia She reports sleep  is better. She continues to take melatonin 3 mg as needed.  Tobacco use disorder Provided smoking cessation counseling. Wellbutrin will also help. She reports she will try to quit.  Follow-up in 3 months or sooner if needed.  More than 50 % of the time was spent for psychoeducation and supportive psychotherapy and care coordination.  This note was generated in part or whole with voice recognition software. Voice recognition is usually quite accurate but there are transcription errors that can and very often do occur. I apologize for any typographical errors that were not detected and corrected.         Jomarie Longs, MD 02/19/2017, 11:23 AM

## 2017-05-13 ENCOUNTER — Ambulatory Visit: Payer: No Typology Code available for payment source | Admitting: Psychiatry

## 2017-05-14 ENCOUNTER — Ambulatory Visit: Payer: No Typology Code available for payment source | Admitting: Psychiatry

## 2017-06-21 ENCOUNTER — Other Ambulatory Visit: Payer: Self-pay | Admitting: Psychiatry

## 2017-06-21 DIAGNOSIS — F331 Major depressive disorder, recurrent, moderate: Secondary | ICD-10-CM

## 2018-12-04 IMAGING — CT CT NECK W/ CM
2 of 3 series · 8 of 14 positions shown, 10 images · IV contrast (iopamidol)
Comparison: None available.

CLINICAL DATA: Initial evaluation for bilateral neck swelling after
working in the yard.

EXAM:
CT NECK WITH CONTRAST
TECHNIQUE: Multidetector CT imaging of the neck was performed using the
standard protocol following the bolus administration of intravenous
contrast.
CONTRAST:  75mL [id number] IOPAMIDOL ([id number]) INJECTION 61%

[Series 2: axial neck · axial · 0.56mm/px · z∈[-333,-219]mm · 3 of 113 slices shown]
[im 29/113  bone]
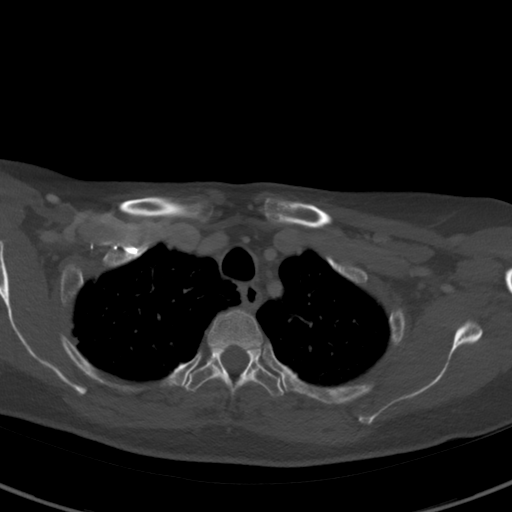
[im 57/113  bone]
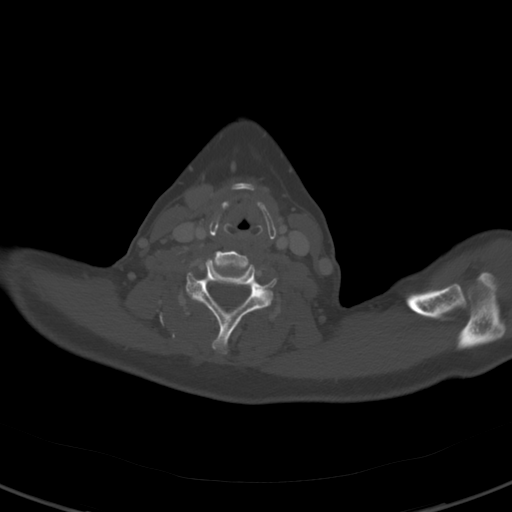
[im 85/113  bone]
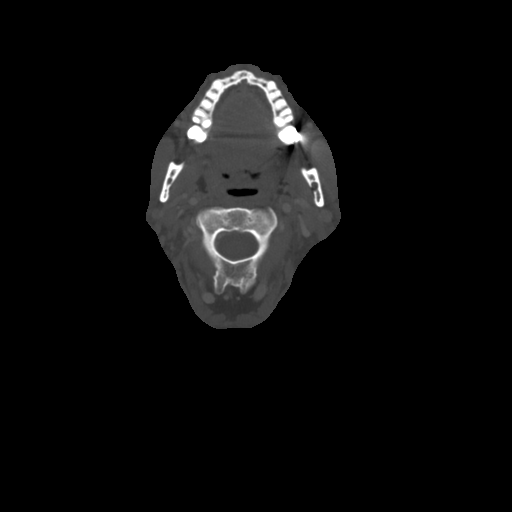

[Series 8: orthogonal ax · axial · 0.45mm/px · z∈[-400,-214]mm · 5 of 147 slices shown, 7 images]
[im 25/147  soft-tissue]
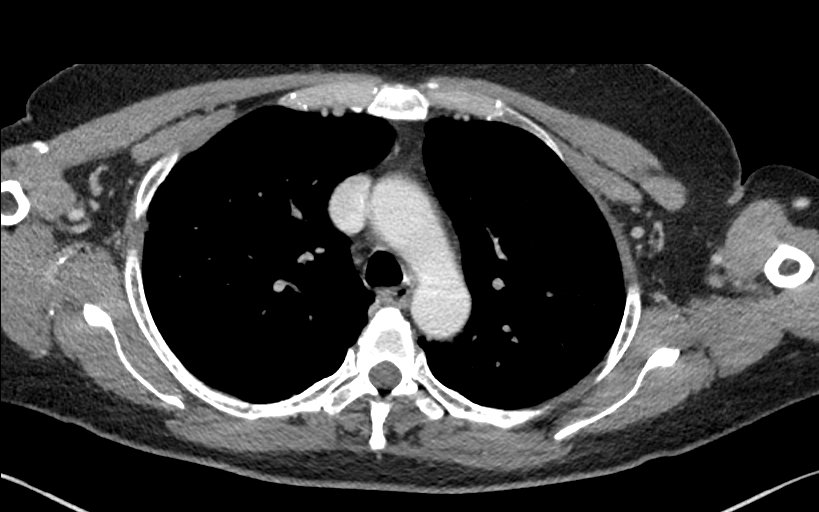
[im 25/147  bone]
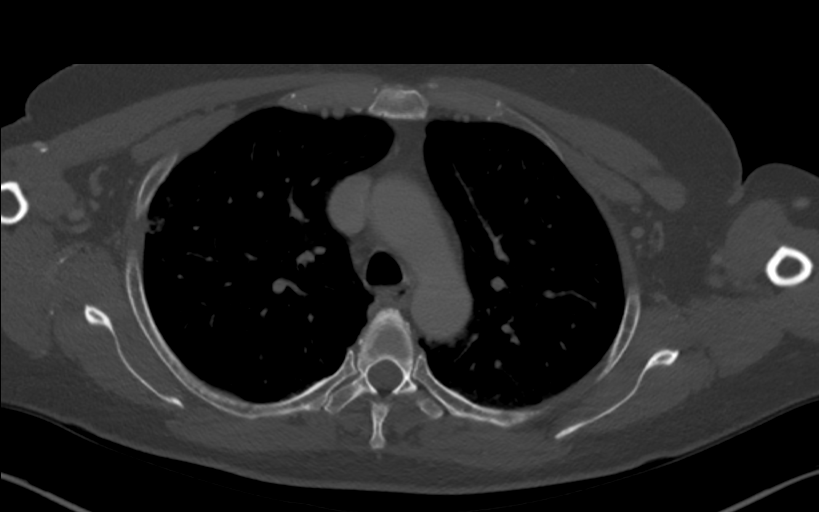
[im 49/147  bone]
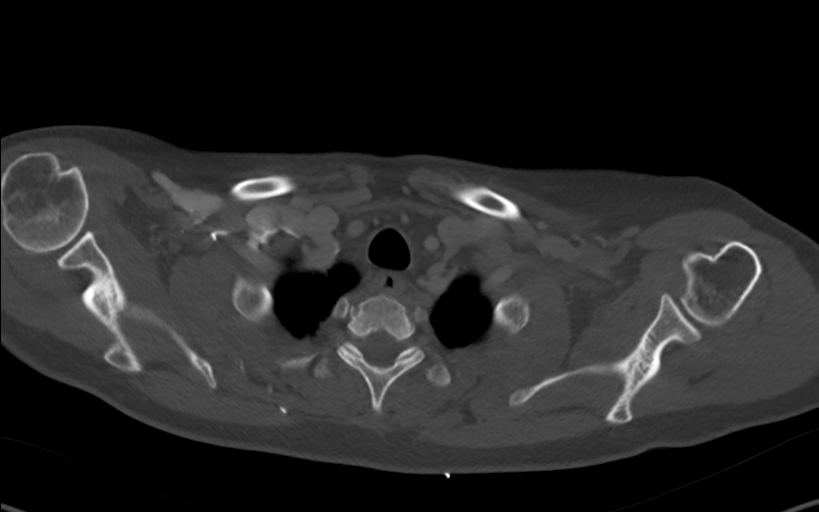
[im 74/147  bone]
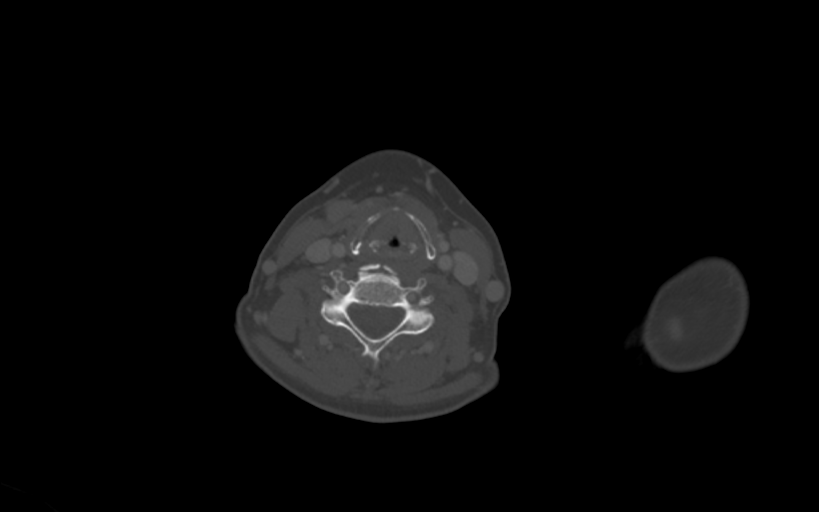
[im 98/147  bone]
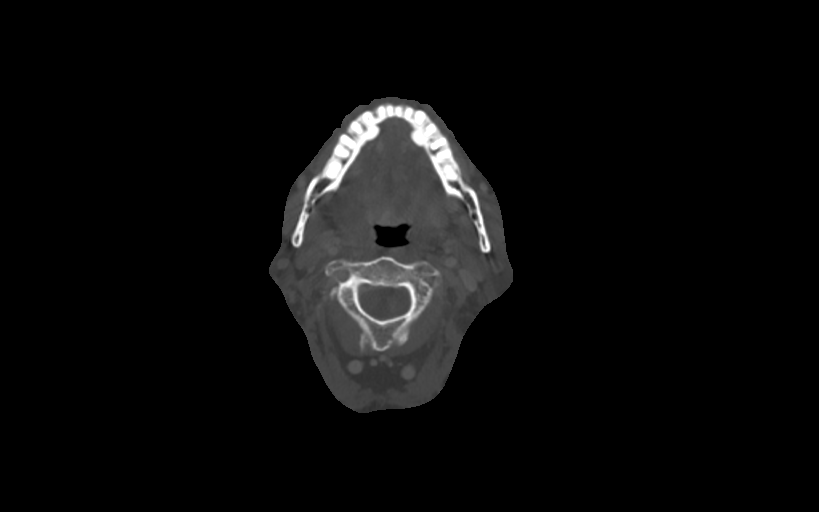
[im 122/147  soft-tissue]
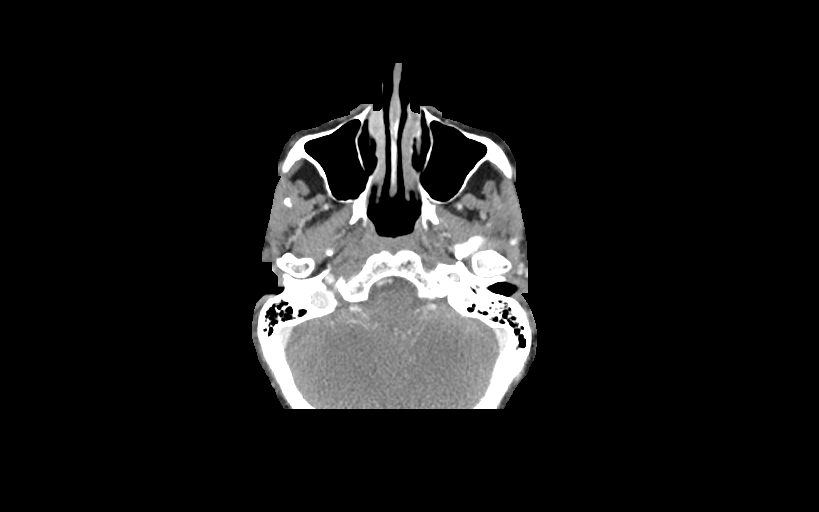
[im 122/147  bone]
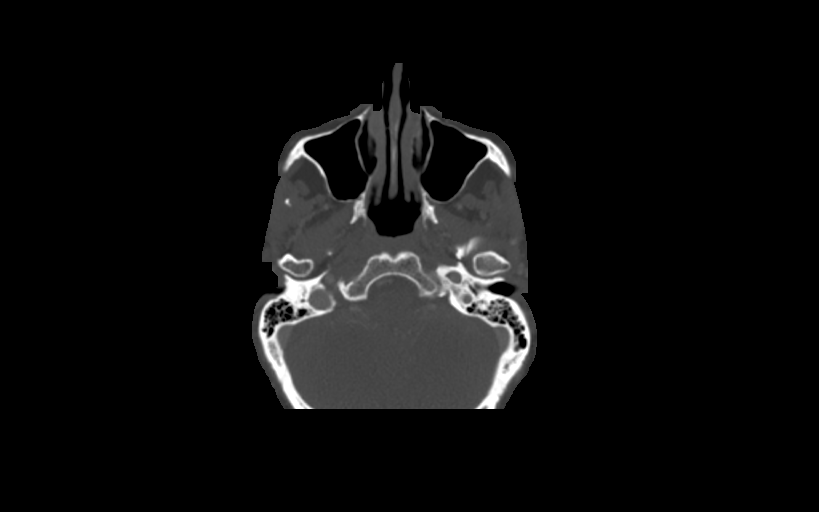

[8 of 14 positions shown; findings below may reference images not displayed]

FINDINGS: Pharynx and larynx: Oral cavity within normal limits without mass
lesion or loculated fluid collection. Valuation mildly limited due
to streak artifact from dental amalgam. No acute abnormality about
the dentition. Palatine tonsils symmetric and normal. Parapharyngeal
fat preserved. Nasopharynx within normal limits. Retropharyngeal
soft tissues are normal. Epiglottis within normal limits. Vallecula
partially effaced by the lingual tonsils but grossly clear.
Remainder of the hypopharynx and supraglottic larynx within normal
limits. True cords apposed and not well evaluated. Subglottic airway
clear.

Salivary glands: Salivary glands including the parotid and
submandibular glands are normal.

Thyroid: Thyroid within normal limits.

Lymph nodes: No pathologically enlarged cervical lymph nodes
identified.

Vascular: Normal intravascular enhancement seen throughout the neck.

Limited intracranial: Unremarkable.

Visualized orbits: Visualized globes normal soft tissues within
normal limits.

Mastoids and visualized paranasal sinuses: Visualized paranasal
sinuses are clear. Visualize mastoids and middle ear cavities are
clear.

Skeleton: No acute osseous abnormality. No worrisome lytic or
blastic osseous lesions. Mild degenerative spondylolysis present at
C4-5 through C6-7.

Upper chest: Visualized upper chest within normal limits. Small
amount of layering secretions noted within the mid esophagus.
Scattered paraseptal and centrilobular emphysema present within the
visualized lungs. 4 mm left upper lobe pulmonary nodule (series 5,
image 44), indeterminate. Visualized lungs are otherwise clear.
IMPRESSION: 1. No mass lesion, adenopathy, or other acute abnormality identified
within the neck.
2. Mild degenerative spondylolysis at C4-5 through C6-7.
3. Emphysema.
4. 4 mm left upper lobe pulmonary nodule, indeterminate. No
follow-up needed if patient is low-risk. Non-contrast chest CT can
be considered in 12 months if patient is high-risk. This
recommendation follows the consensus statement: Guidelines for
Management of Incidental Pulmonary Nodules Detected on CT Images:

## 2019-05-29 ENCOUNTER — Other Ambulatory Visit: Payer: Self-pay | Admitting: Certified Nurse Midwife

## 2019-05-29 DIAGNOSIS — Z1231 Encounter for screening mammogram for malignant neoplasm of breast: Secondary | ICD-10-CM

## 2019-06-01 ENCOUNTER — Ambulatory Visit
Admission: RE | Admit: 2019-06-01 | Discharge: 2019-06-01 | Disposition: A | Discharge status: Home / Self Care | Payer: 59 | Source: Ambulatory Visit | Attending: Certified Nurse Midwife | Admitting: Certified Nurse Midwife

## 2019-06-01 DIAGNOSIS — Z1231 Encounter for screening mammogram for malignant neoplasm of breast: Secondary | ICD-10-CM | POA: Diagnosis present

## 2023-07-21 ENCOUNTER — Emergency Department
Admission: EM | Admit: 2023-07-21 | Discharge: 2023-07-21 | Disposition: A | Discharge status: Home / Self Care | Payer: Self-pay | Attending: Emergency Medicine | Admitting: Emergency Medicine

## 2023-07-21 DIAGNOSIS — R519 Headache, unspecified: Secondary | ICD-10-CM

## 2023-07-21 DIAGNOSIS — G44209 Tension-type headache, unspecified, not intractable: Secondary | ICD-10-CM | POA: Insufficient documentation

## 2023-07-21 DIAGNOSIS — H538 Other visual disturbances: Secondary | ICD-10-CM | POA: Insufficient documentation

## 2023-07-21 LAB — RESP PANEL BY RT-PCR (RSV, FLU A&B, COVID)  RVPGX2
Influenza A by PCR: NEGATIVE
Influenza B by PCR: NEGATIVE
Resp Syncytial Virus by PCR: NEGATIVE
SARS Coronavirus 2 by RT PCR: NEGATIVE

## 2023-07-21 NOTE — ED Provider Notes (Signed)
 [facility] Provider Note    Event Date/Time   First MD Initiated Contact with Patient 07/21/23 1525     (approximate)   History   Blurred Vision    HPI  Jo Moore is a 58 y.o. female    with a past medical history of irrigation of left cornea, MMD, GAD, who presents to the ED complaining of blurry vision. According to the patient, patient states having right frontal headache that is not constant, associated to right watery eye, photosensitivity and blurry vision.  Patient was taking Benadryl for allergies without any resolution of the symptoms.  Patient states she is able to open her eyes in the morning but in the afternoons is more difficult.  Patient denies trauma of foreign body on her.  Patient states she went to the dentist who diagnosed possible right upper tooth infection, prescribed amoxicillin, today is her third day taking amoxicillin.      There are no active problems to display for this patient.    ROS: Patient currently denies any vision changes, tinnitus, difficulty speaking, facial droop, sore throat, chest pain, shortness of breath, abdominal pain, nausea/vomiting/diarrhea, dysuria, or weakness/numbness/paresthesias in any extremity   Physical Exam   Triage Vital Signs: ED Triage Vitals [07/21/23 1437]  Encounter Vitals Group     BP 121/89     Girls Systolic BP Percentile      Girls Diastolic BP Percentile      Boys Systolic BP Percentile      Boys Diastolic BP Percentile      Pulse Rate 84     Resp 18     Temp 98.2 F (36.8 C)     Temp Source Oral     SpO2 97 %     Weight 130 lb (59 kg)     Height 5' 1 (1.549 m)     Head Circumference      Peak Flow      Pain Score 0     Pain Loc      Pain Education      Exclude from Growth Chart     Most recent vital signs: Vitals:   07/21/23 1437  BP: 121/89  Pulse: 84  Resp: 18  Temp: 98.2 F (36.8 C)  SpO2: 97%     Physical Exam Vitals and nursing note reviewed.  Vital  signs are normal  Constitutional:      General: Awake and alert. No acute distress.    Appearance: Normal appearance. The patient is normal weight.      Able to speak in complete sentences without cough or dyspnea  HENT:     Head: Normocephalic and atraumatic.  No frontal or temporal tenderness to palpation.     Mouth: Mucous membranes are moist.  Eyes:   Right eye: Lower eyelid right erythematous of lash conjunctival erythema, epiphora, absence of purulent secretions.  PERRLA, normal EOM's.  No tenderness to palpation Left eye: No conjunctival erythema, mild epiphora.  Nose No congestion/rhinorrhea  CV:                  Good peripheral perfusion.  Regular rate and rhythm  Resp:               Normal effort.  Equal breath sounds bilaterally.  Abd:                 No distention.  Soft, nontender.  No rebound or guarding.  Musculoskeletal:  General: No swelling. Normal range of motion.  Skin:    General: Skin is warm and dry.     Capillary Refill: Capillary refill takes less than 2 seconds.     Findings: No rash.  Neurological:     Mental Status: The patient is awake and alert. MAE spontaneously. No gross focal neurologic deficits are appreciated.  Psychiatric Mood and affect are normal. Speech and behavior are normal.  ED Results / Procedures / Treatments   Labs (all labs ordered are listed, but only abnormal results are displayed) Labs Reviewed  RESP PANEL BY RT-PCR (RSV, FLU A&B, COVID)  [id number]     EKG     RADIOLOGY I independently reviewed and interpreted imaging and agree with radiologists findings.     PROCEDURES:  Critical Care performed:   Procedures   MEDICATIONS ORDERED IN ED: Medications - No data to display Clinical Course as of 07/21/23 1647  Wed Jul 21, 2023  1638 Resp panel by RT-PCR (RSV, Flu A&B, Covid) Anterior Nasal Swab Negative [AE]  1638 Updated the patient about visual testing, respiratory panel negative.  Advised patient to have  your annual eye appointment with ophthalmologist.  Patient agreed with the plan she is going to be discharged with a referral to ophthalmology [AE]    Clinical Course User Index [AE] Janit Kast, PA-C    IMPRESSION / MDM / ASSESSMENT AND PLAN / ED COURSE  I reviewed the triage vital signs and the nursing notes.  Differential diagnosis includes, but is not limited to, acute angle-closure, ocular trauma, cluster migraine, periorbital cellulitis  Patient's presentation is most consistent with acute, uncomplicated illness.   Jo Moore is a 58 y.o., female, resents today with history of right frontal headache, associated with right watery eye, and blurry vision.  Patient has been treating for right upper dental infection with amoxicillin.  Physical exam right eye with conjunctival erythema, no fixed pupil, epiphora.  I will order visual test screening and give oxygen to rule out cluster migraine.  Patient did not want to try the oxygen because she is not having any headache.   visual screening: Both eyes 20/20, right eye 20/50, left eye 20/20 Patient's diagnosis is consistent with migraine, blurry vision.  I did review the patient's allergies and medications.The patient is in stable and satisfactory condition for discharge home  Patient will be discharged home without prescriptions. Patient is to follow up with ophthalmology as needed or otherwise directed. Patient is given ED precautions to return to the ED for any worsening or new symptoms. Discussed plan of care with patient, answered all of patient's questions, Patient agreeable to plan of care. Advised patient to take medications according to the instructions on the label. Discussed possible side effects of new medications. Patient verbalized understanding.     FINAL CLINICAL IMPRESSION(S) / ED DIAGNOSES   Final diagnoses:  Nonintractable headache, unspecified chronicity pattern, unspecified headache type  Blurry vision, right eye      Rx / DC Orders   ED Discharge Orders     None        Note:  This document was prepared using Dragon voice recognition software and may include unintentional dictation errors.   Janit Kast, PA-C 07/21/23 1647    Dorothyann Drivers, MD 07/21/23 [place]

## 2023-07-21 NOTE — ED Triage Notes (Signed)
 Pt presents to the ED via POV from [facility] clinic. Pt has had blurred vision x1 week. Pt reports having a headache last night, but states that it has sense resolved. Pt reports that her eyes feel like they are continually running. Pt states that the blurred vision is worse in the sunlight and after a full day. Pt also reports sinus pressure and congestion.

## 2023-07-21 NOTE — Discharge Instructions (Signed)
 You have been diagnosed with headache, right eye blurry vision.  Please call ophthalmology and make an appointment for tomorrow.  Please come back to ED or go to your PCP you have any symptoms symptoms worse
